# Patient Record
Sex: Male | Born: 1995 | Race: White | Hispanic: No | Marital: Single | Smoking: Never smoker
Health system: Southern US, Community
[De-identification: ages and names within clinical notes are randomized; demographics above are authoritative.]

## PROBLEM LIST (undated history)

## (undated) DIAGNOSIS — N200 Calculus of kidney: Secondary | ICD-10-CM

## (undated) DIAGNOSIS — F909 Attention-deficit hyperactivity disorder, unspecified type: Secondary | ICD-10-CM

## (undated) HISTORY — PX: APPENDECTOMY: SHX54

---
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
## 2013-09-14 ENCOUNTER — Institutional Professional Consult (permissible substitution): Payer: BC Managed Care – PPO | Admitting: Pediatrics

## 2013-10-04 ENCOUNTER — Institutional Professional Consult (permissible substitution): Payer: BC Managed Care – PPO | Admitting: Pediatrics

## 2013-10-04 DIAGNOSIS — F909 Attention-deficit hyperactivity disorder, unspecified type: Secondary | ICD-10-CM

## 2013-10-27 ENCOUNTER — Institutional Professional Consult (permissible substitution): Payer: BC Managed Care – PPO | Admitting: Pediatrics

## 2014-02-28 ENCOUNTER — Institutional Professional Consult (permissible substitution): Payer: BC Managed Care – PPO | Admitting: Pediatrics

## 2015-04-23 ENCOUNTER — Inpatient Hospital Stay (HOSPITAL_COMMUNITY)
Admission: EM | Admit: 2015-04-23 | Discharge: 2015-04-25 | DRG: 494 | Disposition: A | Discharge status: Home / Self Care | Payer: BLUE CROSS/BLUE SHIELD | Attending: Orthopedic Surgery | Admitting: Orthopedic Surgery

## 2015-04-23 ENCOUNTER — Encounter (HOSPITAL_COMMUNITY): Payer: Self-pay | Admitting: Emergency Medicine

## 2015-04-23 ENCOUNTER — Encounter (HOSPITAL_COMMUNITY)
Admission: EM | Disposition: A | Discharge status: Home / Self Care | Payer: Self-pay | Source: Home / Self Care | Attending: Orthopedic Surgery

## 2015-04-23 ENCOUNTER — Emergency Department (HOSPITAL_COMMUNITY): Payer: BLUE CROSS/BLUE SHIELD

## 2015-04-23 ENCOUNTER — Inpatient Hospital Stay (HOSPITAL_COMMUNITY): Payer: BLUE CROSS/BLUE SHIELD

## 2015-04-23 ENCOUNTER — Inpatient Hospital Stay (HOSPITAL_COMMUNITY): Payer: BLUE CROSS/BLUE SHIELD | Admitting: Anesthesiology

## 2015-04-23 DIAGNOSIS — Y92410 Unspecified street and highway as the place of occurrence of the external cause: Secondary | ICD-10-CM

## 2015-04-23 DIAGNOSIS — S82201B Unspecified fracture of shaft of right tibia, initial encounter for open fracture type I or II: Secondary | ICD-10-CM | POA: Diagnosis present

## 2015-04-23 DIAGNOSIS — M79604 Pain in right leg: Secondary | ICD-10-CM | POA: Diagnosis present

## 2015-04-23 DIAGNOSIS — Z8781 Personal history of (healed) traumatic fracture: Secondary | ICD-10-CM

## 2015-04-23 DIAGNOSIS — S82401B Unspecified fracture of shaft of right fibula, initial encounter for open fracture type I or II: Principal | ICD-10-CM | POA: Diagnosis present

## 2015-04-23 DIAGNOSIS — S82409A Unspecified fracture of shaft of unspecified fibula, initial encounter for closed fracture: Secondary | ICD-10-CM

## 2015-04-23 DIAGNOSIS — Z9889 Other specified postprocedural states: Secondary | ICD-10-CM

## 2015-04-23 DIAGNOSIS — S82209A Unspecified fracture of shaft of unspecified tibia, initial encounter for closed fracture: Secondary | ICD-10-CM | POA: Diagnosis present

## 2015-04-23 HISTORY — PX: TIBIA IM NAIL INSERTION: SHX2516

## 2015-04-23 HISTORY — DX: Calculus of kidney: N20.0

## 2015-04-23 HISTORY — DX: Attention-deficit hyperactivity disorder, unspecified type: F90.9

## 2015-04-23 LAB — COMPREHENSIVE METABOLIC PANEL
ALBUMIN: 3.8 g/dL (ref 3.5–5.0)
ALK PHOS: 62 U/L (ref 38–126)
ALT: 30 U/L (ref 17–63)
ANION GAP: 8 (ref 5–15)
AST: 27 U/L (ref 15–41)
BILIRUBIN TOTAL: 0.4 mg/dL (ref 0.3–1.2)
BUN: 12 mg/dL (ref 6–20)
CALCIUM: 8.6 mg/dL — AB (ref 8.9–10.3)
CO2: 25 mmol/L (ref 22–32)
Chloride: 105 mmol/L (ref 101–111)
Creatinine, Ser: 1.12 mg/dL (ref 0.61–1.24)
GFR calc Af Amer: >60 mL/min (ref >60)
GFR calc non Af Amer: >60 mL/min (ref >60)
GLUCOSE: 101 mg/dL — AB (ref 65–99)
Potassium: 3.8 mmol/L (ref 3.5–5.1)
Sodium: 138 mmol/L (ref 135–145)
TOTAL PROTEIN: 6.9 g/dL (ref 6.5–8.1)

## 2015-04-23 LAB — URINALYSIS, ROUTINE W REFLEX MICROSCOPIC
BILIRUBIN URINE: NEGATIVE
GLUCOSE, UA: NEGATIVE mg/dL
Ketones, ur: 15 mg/dL — AB
Nitrite: NEGATIVE
PH: 5 (ref 5.0–8.0)
Protein, ur: 100 mg/dL — AB
Urobilinogen, UA: 0.2 mg/dL (ref 0.0–1.0)

## 2015-04-23 LAB — RAPID URINE DRUG SCREEN, HOSP PERFORMED
AMPHETAMINES: NOT DETECTED
BARBITURATES: NOT DETECTED
Benzodiazepines: NOT DETECTED
Cocaine: NOT DETECTED
Opiates: POSITIVE — AB
TETRAHYDROCANNABINOL: NOT DETECTED

## 2015-04-23 LAB — CBC WITH DIFFERENTIAL/PLATELET
BASOS PCT: 0 %
Basophils Absolute: 0 10*3/uL (ref 0.0–0.1)
Eosinophils Absolute: 0.1 10*3/uL (ref 0.0–0.7)
Eosinophils Relative: 0 %
HEMATOCRIT: 42.2 % (ref 39.0–52.0)
HEMOGLOBIN: 14.3 g/dL (ref 13.0–17.0)
LYMPHS PCT: 8 %
Lymphs Abs: 1.2 10*3/uL (ref 0.7–4.0)
MCH: 28.5 pg (ref 26.0–34.0)
MCHC: 33.9 g/dL (ref 30.0–36.0)
MCV: 84.1 fL (ref 78.0–100.0)
MONOS PCT: 7 %
Monocytes Absolute: 1.2 10*3/uL — ABNORMAL HIGH (ref 0.1–1.0)
NEUTROS ABS: 13.4 10*3/uL — AB (ref 1.7–7.7)
NEUTROS PCT: 85 %
Platelets: 194 10*3/uL (ref 150–400)
RBC: 5.02 MIL/uL (ref 4.22–5.81)
RDW: 13.2 % (ref 11.5–15.5)
WBC: 15.9 10*3/uL — ABNORMAL HIGH (ref 4.0–10.5)

## 2015-04-23 LAB — ETHANOL: Alcohol, Ethyl (B): 55 mg/dL — ABNORMAL HIGH (ref <5)

## 2015-04-23 LAB — SURGICAL PCR SCREEN
MRSA, PCR: NEGATIVE
Staphylococcus aureus: NEGATIVE

## 2015-04-23 LAB — LACTIC ACID, PLASMA: Lactic Acid, Venous: 1.8 mmol/L (ref 0.5–2.0)

## 2015-04-23 LAB — URINE MICROSCOPIC-ADD ON

## 2015-04-23 LAB — LIPASE, BLOOD: Lipase: 27 U/L (ref 22–51)

## 2015-04-23 SURGERY — INSERTION, INTRAMEDULLARY ROD, TIBIA
Anesthesia: General | Laterality: Right

## 2015-04-23 MED ORDER — CEFAZOLIN SODIUM-DEXTROSE 2-3 GM-% IV SOLR
INTRAVENOUS | Status: DC | PRN
  Administered 2015-04-23: 2 g via INTRAVENOUS

## 2015-04-23 MED ORDER — ONDANSETRON HCL 4 MG/2ML IJ SOLN: 4.0000 mg | Freq: Four times a day (QID) | INTRAMUSCULAR | Status: DC | PRN

## 2015-04-23 MED ORDER — BISACODYL 10 MG RE SUPP: 10.0000 mg | Freq: Every day | RECTAL | Status: DC | PRN

## 2015-04-23 MED ORDER — SODIUM CHLORIDE 0.9 % IV BOLUS (SEPSIS)
1000.0000 mL | Freq: Once | INTRAVENOUS | Status: AC
  Administered 2015-04-23: 1000 mL via INTRAVENOUS

## 2015-04-23 MED ORDER — DOCUSATE SODIUM 100 MG PO CAPS: 100.0000 mg | ORAL_CAPSULE | Freq: Two times a day (BID) | ORAL | Status: DC

## 2015-04-23 MED ORDER — ONDANSETRON HCL 4 MG/2ML IJ SOLN
INTRAMUSCULAR | Status: AC
  Filled 2015-04-23: qty 2

## 2015-04-23 MED ORDER — CEFAZOLIN SODIUM 1-5 GM-% IV SOLN
1.0000 g | Freq: Once | INTRAVENOUS | Status: AC
  Administered 2015-04-23: 1 g via INTRAVENOUS
  Filled 2015-04-23: qty 50

## 2015-04-23 MED ORDER — ONDANSETRON HCL 4 MG/2ML IJ SOLN: 4.0000 mg | Freq: Three times a day (TID) | INTRAMUSCULAR | Status: AC | PRN

## 2015-04-23 MED ORDER — MIDAZOLAM HCL 2 MG/2ML IJ SOLN
INTRAMUSCULAR | Status: AC
  Filled 2015-04-23: qty 4

## 2015-04-23 MED ORDER — DOCUSATE SODIUM 100 MG PO CAPS
100.0000 mg | ORAL_CAPSULE | Freq: Two times a day (BID) | ORAL | Status: DC
  Administered 2015-04-23 – 2015-04-25 (×4): 100 mg via ORAL
  Filled 2015-04-23 (×4): qty 1

## 2015-04-23 MED ORDER — SODIUM CHLORIDE 0.9 % IJ SOLN: 3.0000 mL | INTRAMUSCULAR | Status: DC | PRN

## 2015-04-23 MED ORDER — FENTANYL CITRATE (PF) 250 MCG/5ML IJ SOLN
INTRAMUSCULAR | Status: AC
  Filled 2015-04-23: qty 5

## 2015-04-23 MED ORDER — GLYCOPYRROLATE 0.2 MG/ML IJ SOLN
INTRAMUSCULAR | Status: DC | PRN
  Administered 2015-04-23: .7 mg via INTRAVENOUS

## 2015-04-23 MED ORDER — IOHEXOL 300 MG/ML  SOLN
100.0000 mL | Freq: Once | INTRAMUSCULAR | Status: AC | PRN
  Administered 2015-04-23: 100 mL via INTRAVENOUS

## 2015-04-23 MED ORDER — LIDOCAINE HCL (CARDIAC) 20 MG/ML IV SOLN
INTRAVENOUS | Status: AC
  Filled 2015-04-23: qty 10

## 2015-04-23 MED ORDER — METHOCARBAMOL 1000 MG/10ML IJ SOLN
500.0000 mg | Freq: Once | INTRAVENOUS | Status: AC
  Administered 2015-04-23: 500 mg via INTRAVENOUS
  Filled 2015-04-23: qty 5

## 2015-04-23 MED ORDER — ACETAMINOPHEN 650 MG RE SUPP: 650.0000 mg | Freq: Four times a day (QID) | RECTAL | Status: DC | PRN

## 2015-04-23 MED ORDER — SODIUM CHLORIDE 0.9 % IV SOLN: INTRAVENOUS | Status: DC

## 2015-04-23 MED ORDER — FENTANYL CITRATE (PF) 100 MCG/2ML IJ SOLN
25.0000 ug | Freq: Once | INTRAMUSCULAR | Status: AC
  Administered 2015-04-23: 25 ug via INTRAVENOUS
  Filled 2015-04-23: qty 2

## 2015-04-23 MED ORDER — SUCCINYLCHOLINE CHLORIDE 20 MG/ML IJ SOLN
INTRAMUSCULAR | Status: AC
  Filled 2015-04-23: qty 1

## 2015-04-23 MED ORDER — FLEET ENEMA 7-19 GM/118ML RE ENEM: 1.0000 | ENEMA | Freq: Once | RECTAL | Status: DC | PRN

## 2015-04-23 MED ORDER — LACTATED RINGERS IV SOLN
INTRAVENOUS | Status: DC | PRN
  Administered 2015-04-23 (×3): via INTRAVENOUS

## 2015-04-23 MED ORDER — CEFAZOLIN SODIUM 1-5 GM-% IV SOLN
1.0000 g | Freq: Four times a day (QID) | INTRAVENOUS | Status: AC
  Administered 2015-04-23 – 2015-04-24 (×2): 1 g via INTRAVENOUS
  Filled 2015-04-23 (×2): qty 50

## 2015-04-23 MED ORDER — LIDOCAINE HCL (CARDIAC) 20 MG/ML IV SOLN
INTRAVENOUS | Status: AC
  Filled 2015-04-23: qty 5

## 2015-04-23 MED ORDER — ONDANSETRON HCL 4 MG/2ML IJ SOLN
INTRAMUSCULAR | Status: DC | PRN
  Administered 2015-04-23: 4 mg via INTRAVENOUS

## 2015-04-23 MED ORDER — METOCLOPRAMIDE HCL 5 MG/ML IJ SOLN: 5.0000 mg | Freq: Three times a day (TID) | INTRAMUSCULAR | Status: DC | PRN

## 2015-04-23 MED ORDER — NEOSTIGMINE METHYLSULFATE 10 MG/10ML IV SOLN
INTRAVENOUS | Status: DC | PRN
  Administered 2015-04-23: 4 mg via INTRAVENOUS

## 2015-04-23 MED ORDER — OXYCODONE HCL 5 MG PO TABS: 5.0000 mg | ORAL_TABLET | Freq: Once | ORAL | Status: DC | PRN

## 2015-04-23 MED ORDER — HYDROMORPHONE HCL 1 MG/ML IJ SOLN
INTRAMUSCULAR | Status: AC
  Administered 2015-04-23: 2 mg via INTRAVENOUS
  Filled 2015-04-23: qty 1

## 2015-04-23 MED ORDER — HYDROMORPHONE HCL 1 MG/ML IJ SOLN
0.5000 mg | INTRAMUSCULAR | Status: DC | PRN
  Administered 2015-04-24 (×2): 1 mg via INTRAVENOUS
  Filled 2015-04-23 (×2): qty 1

## 2015-04-23 MED ORDER — ACETAMINOPHEN 325 MG PO TABS: 650.0000 mg | ORAL_TABLET | Freq: Four times a day (QID) | ORAL | Status: DC | PRN

## 2015-04-23 MED ORDER — POLYETHYLENE GLYCOL 3350 17 G PO PACK
17.0000 g | PACK | Freq: Every day | ORAL | Status: DC | PRN
  Filled 2015-04-23: qty 1

## 2015-04-23 MED ORDER — ROCURONIUM BROMIDE 100 MG/10ML IV SOLN
INTRAVENOUS | Status: DC | PRN
  Administered 2015-04-23: 50 mg via INTRAVENOUS

## 2015-04-23 MED ORDER — OXYCODONE HCL 5 MG/5ML PO SOLN: 5.0000 mg | Freq: Once | ORAL | Status: DC | PRN

## 2015-04-23 MED ORDER — ROCURONIUM BROMIDE 50 MG/5ML IV SOLN
INTRAVENOUS | Status: AC
  Filled 2015-04-23: qty 1

## 2015-04-23 MED ORDER — TETANUS-DIPHTHERIA TOXOIDS TD 5-2 LFU IM INJ
0.5000 mL | INJECTION | Freq: Once | INTRAMUSCULAR | Status: AC
  Administered 2015-04-23: 0.5 mL via INTRAMUSCULAR
  Filled 2015-04-23: qty 0.5

## 2015-04-23 MED ORDER — HYDROMORPHONE HCL 1 MG/ML IJ SOLN
INTRAMUSCULAR | Status: AC
  Filled 2015-04-23: qty 2

## 2015-04-23 MED ORDER — PHENYLEPHRINE 40 MCG/ML (10ML) SYRINGE FOR IV PUSH (FOR BLOOD PRESSURE SUPPORT)
PREFILLED_SYRINGE | INTRAVENOUS | Status: AC
  Filled 2015-04-23: qty 10

## 2015-04-23 MED ORDER — SODIUM CHLORIDE 0.9 % IV SOLN: 250.0000 mL | INTRAVENOUS | Status: DC | PRN

## 2015-04-23 MED ORDER — PROPOFOL 10 MG/ML IV BOLUS
INTRAVENOUS | Status: DC | PRN
  Administered 2015-04-23: 200 mg via INTRAVENOUS

## 2015-04-23 MED ORDER — LIDOCAINE HCL (CARDIAC) 20 MG/ML IV SOLN
INTRAVENOUS | Status: DC | PRN
  Administered 2015-04-23: 75 mg via INTRAVENOUS

## 2015-04-23 MED ORDER — HYDROMORPHONE HCL 1 MG/ML IJ SOLN
0.5000 mg | INTRAMUSCULAR | Status: DC | PRN
  Administered 2015-04-23: 2 mg via INTRAVENOUS
  Filled 2015-04-23: qty 2

## 2015-04-23 MED ORDER — METOCLOPRAMIDE HCL 5 MG PO TABS: 5.0000 mg | ORAL_TABLET | Freq: Three times a day (TID) | ORAL | Status: DC | PRN

## 2015-04-23 MED ORDER — HYDROCODONE-ACETAMINOPHEN 5-325 MG PO TABS
1.0000 | ORAL_TABLET | ORAL | Status: DC | PRN
  Administered 2015-04-24 – 2015-04-25 (×7): 2 via ORAL
  Filled 2015-04-23 (×7): qty 2

## 2015-04-23 MED ORDER — PROPOFOL 10 MG/ML IV BOLUS
INTRAVENOUS | Status: AC
  Filled 2015-04-23: qty 20

## 2015-04-23 MED ORDER — HYDROMORPHONE HCL 1 MG/ML IJ SOLN
0.2500 mg | INTRAMUSCULAR | Status: DC | PRN
  Administered 2015-04-23 (×2): 0.5 mg via INTRAVENOUS

## 2015-04-23 MED ORDER — SODIUM CHLORIDE 0.9 % IJ SOLN
3.0000 mL | Freq: Two times a day (BID) | INTRAMUSCULAR | Status: DC
  Administered 2015-04-23: 3 mL via INTRAVENOUS

## 2015-04-23 MED ORDER — MIDAZOLAM HCL 5 MG/5ML IJ SOLN
INTRAMUSCULAR | Status: DC | PRN
  Administered 2015-04-23: 2 mg via INTRAVENOUS

## 2015-04-23 MED ORDER — ASPIRIN 325 MG PO TABS
325.0000 mg | ORAL_TABLET | Freq: Two times a day (BID) | ORAL | Status: DC
  Administered 2015-04-24 – 2015-04-25 (×3): 325 mg via ORAL
  Filled 2015-04-23 (×3): qty 1

## 2015-04-23 MED ORDER — ONDANSETRON HCL 4 MG PO TABS: 4.0000 mg | ORAL_TABLET | Freq: Four times a day (QID) | ORAL | Status: DC | PRN

## 2015-04-23 MED ORDER — SODIUM CHLORIDE 0.9 % IR SOLN
Status: DC | PRN
  Administered 2015-04-23: 1000 mL

## 2015-04-23 MED ORDER — HYDROMORPHONE HCL 1 MG/ML IJ SOLN
2.0000 mg | Freq: Once | INTRAMUSCULAR | Status: AC
  Administered 2015-04-23: 2 mg via INTRAVENOUS

## 2015-04-23 MED ORDER — GLYCOPYRROLATE 0.2 MG/ML IJ SOLN
INTRAMUSCULAR | Status: AC
  Filled 2015-04-23: qty 3

## 2015-04-23 MED ORDER — NEOSTIGMINE METHYLSULFATE 10 MG/10ML IV SOLN
INTRAVENOUS | Status: AC
  Filled 2015-04-23: qty 1

## 2015-04-23 MED ORDER — POLYETHYLENE GLYCOL 3350 17 G PO PACK: 17.0000 g | PACK | Freq: Every day | ORAL | Status: DC | PRN

## 2015-04-23 MED ORDER — HYDROCODONE-ACETAMINOPHEN 7.5-325 MG PO TABS: 1.0000 | ORAL_TABLET | Freq: Four times a day (QID) | ORAL | Status: DC | PRN

## 2015-04-23 MED ORDER — FENTANYL CITRATE (PF) 100 MCG/2ML IJ SOLN
INTRAMUSCULAR | Status: DC | PRN
  Administered 2015-04-23: 50 ug via INTRAVENOUS
  Administered 2015-04-23: 150 ug via INTRAVENOUS
  Administered 2015-04-23 (×3): 100 ug via INTRAVENOUS

## 2015-04-23 MED ORDER — HYDROMORPHONE HCL 1 MG/ML IJ SOLN
1.0000 mg | INTRAMUSCULAR | Status: AC | PRN
  Administered 2015-04-23 (×3): 1 mg via INTRAVENOUS
  Filled 2015-04-23 (×3): qty 1

## 2015-04-23 SURGICAL SUPPLY — 65 items
BANDAGE ELASTIC 4 VELCRO ST LF (GAUZE/BANDAGES/DRESSINGS) ×3 IMPLANT
BANDAGE ELASTIC 6 VELCRO ST LF (GAUZE/BANDAGES/DRESSINGS) ×3 IMPLANT
BANDAGE ESMARK 6X9 LF (GAUZE/BANDAGES/DRESSINGS) IMPLANT
BEAD TIP GUIDEWIRE ×3 IMPLANT
BIT DRILL CALIBRATED 4.3X320MM (BIT) ×1 IMPLANT
BIT DRILL CROWE POINT TWST 4.3 (DRILL) ×1 IMPLANT
BNDG COHESIVE 4X5 TAN STRL (GAUZE/BANDAGES/DRESSINGS) IMPLANT
BNDG ESMARK 6X9 LF (GAUZE/BANDAGES/DRESSINGS)
BNDG GAUZE ELAST 4 BULKY (GAUZE/BANDAGES/DRESSINGS) IMPLANT
COVER SURGICAL LIGHT HANDLE (MISCELLANEOUS) ×3 IMPLANT
CUFF TOURNIQUET SINGLE 34IN LL (TOURNIQUET CUFF) ×3 IMPLANT
CUFF TOURNIQUET SINGLE 44IN (TOURNIQUET CUFF) IMPLANT
DRAPE C-ARM 42X72 X-RAY (DRAPES) ×3 IMPLANT
DRAPE C-ARMOR (DRAPES) ×3 IMPLANT
DRAPE IMP U-DRAPE 54X76 (DRAPES) ×3 IMPLANT
DRAPE INCISE IOBAN 66X45 STRL (DRAPES) ×3 IMPLANT
DRAPE ORTHO SPLIT 77X108 STRL (DRAPES) ×4
DRAPE SURG ORHT 6 SPLT 77X108 (DRAPES) ×2 IMPLANT
DRAPE U-SHAPE 47X51 STRL (DRAPES) ×3 IMPLANT
DRILL CALIBRATED 4.3X320MM (BIT) ×3
DRILL CROWE POINT TWIST 4.3 (DRILL) ×3
DRSG ADAPTIC 3X8 NADH LF (GAUZE/BANDAGES/DRESSINGS) IMPLANT
DRSG PAD ABDOMINAL 8X10 ST (GAUZE/BANDAGES/DRESSINGS) ×12 IMPLANT
ELECT REM PT RETURN 9FT ADLT (ELECTROSURGICAL) ×3
ELECTRODE REM PT RTRN 9FT ADLT (ELECTROSURGICAL) ×1 IMPLANT
EVACUATOR 1/8 PVC DRAIN (DRAIN) IMPLANT
GAUZE SPONGE 4X4 12PLY STRL (GAUZE/BANDAGES/DRESSINGS) IMPLANT
GAUZE XEROFORM 5X9 LF (GAUZE/BANDAGES/DRESSINGS) ×3 IMPLANT
GLOVE BIOGEL PI IND STRL 7.5 (GLOVE) ×1 IMPLANT
GLOVE BIOGEL PI INDICATOR 7.5 (GLOVE) ×2
GLOVE SKINSENSE NS SZ8.0 LF (GLOVE) ×4
GLOVE SKINSENSE STRL SZ8.0 LF (GLOVE) ×2 IMPLANT
GLOVE SURG ORTHO 8.0 STRL STRW (GLOVE) ×3 IMPLANT
GOWN STRL REUS W/ TWL LRG LVL3 (GOWN DISPOSABLE) ×3 IMPLANT
GOWN STRL REUS W/TWL LRG LVL3 (GOWN DISPOSABLE) ×6
GUIDEWIRE 2.6X80 BEAD TIP (Wire) ×1 IMPLANT
GUIDWIRE 2.6X80 BEAD TIP (Wire) ×2 IMPLANT
KIT BASIN OR (CUSTOM PROCEDURE TRAY) ×3 IMPLANT
KIT ROOM TURNOVER OR (KITS) ×3 IMPLANT
NAIL TIBIAL PHOENIX 9.0X360 (Nail) ×3 IMPLANT
PACK ORTHO EXTREMITY (CUSTOM PROCEDURE TRAY) ×3 IMPLANT
PACK UNIVERSAL I (CUSTOM PROCEDURE TRAY) ×3 IMPLANT
PAD ARMBOARD 7.5X6 YLW CONV (MISCELLANEOUS) ×6 IMPLANT
PAD CAST 4YDX4 CTTN HI CHSV (CAST SUPPLIES) ×2 IMPLANT
PADDING CAST COTTON 4X4 STRL (CAST SUPPLIES) ×4
SCREW CORT TI DBL LEAD 5X38 (Screw) ×6 IMPLANT
SCREW CORT TI DBL LEAD 5X48 (Screw) ×3 IMPLANT
SCREW CORT TI DBL LEAD 5X50 (Screw) ×3 IMPLANT
SPLINT PLASTER CAST XFAST 5X30 (CAST SUPPLIES) ×1 IMPLANT
SPLINT PLASTER XFAST SET 5X30 (CAST SUPPLIES) ×2
SPONGE GAUZE 4X4 12PLY STER LF (GAUZE/BANDAGES/DRESSINGS) ×3 IMPLANT
STAPLER VISISTAT 35W (STAPLE) ×3 IMPLANT
STOCKINETTE TUBULAR 6 INCH (GAUZE/BANDAGES/DRESSINGS) IMPLANT
SUT PROLENE 3 0 PS 2 (SUTURE) IMPLANT
SUT VIC AB 0 CT1 27 (SUTURE) ×2
SUT VIC AB 0 CT1 27XBRD ANBCTR (SUTURE) ×1 IMPLANT
SUT VIC AB 1 CT1 27 (SUTURE) ×2
SUT VIC AB 1 CT1 27XBRD ANBCTR (SUTURE) ×1 IMPLANT
SUT VIC AB 2-0 CT1 27 (SUTURE) ×2
SUT VIC AB 2-0 CT1 TAPERPNT 27 (SUTURE) ×1 IMPLANT
TOWEL OR 17X24 6PK STRL BLUE (TOWEL DISPOSABLE) ×3 IMPLANT
TOWEL OR 17X26 10 PK STRL BLUE (TOWEL DISPOSABLE) ×3 IMPLANT
TUBE CONNECTING 12'X1/4 (SUCTIONS) ×1
TUBE CONNECTING 12X1/4 (SUCTIONS) ×2 IMPLANT
YANKAUER SUCT BULB TIP NO VENT (SUCTIONS) ×3 IMPLANT
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
## 2015-04-23 NOTE — Brief Op Note (Signed)
04/23/2015  5:11 PM  PATIENT:  Frank Gregory  19 y.o. male  PRE-OPERATIVE DIAGNOSIS:  Grade I open comminuted tibia fibula fracture   POST-OPERATIVE DIAGNOSIS:  Grade I open comminuted tibia fibula fracture  PROCEDURE:  Procedure(s): INTRAMEDULLARY (IM) NAIL TIBIAL (Right) INTRAMEDULLARY (IM) NAIL FIBULA (Right)   Excisional and non-excisional drainage right leg wound  SURGEON:  Surgeon(s) and Role:    * Durene Romans, MD - Primary  PHYSICIAN ASSISTANT: None  ASSISTANTS: Surgical team   ANESTHESIA:   general  EBL:   150cc  BLOOD ADMINISTERED:none  DRAINS: none   LOCAL MEDICATIONS USED:  NONE  SPECIMEN:  No Specimen  DISPOSITION OF SPECIMEN:  N/A  COUNTS:  YES  TOURNIQUET:   27 min at  DICTATION: .Other Dictation: Dictation Number [id number]  PLAN OF CARE: Admit to inpatient   PATIENT DISPOSITION:  PACU - hemodynamically stable.   Delay start of Pharmacological VTE agent (>24hrs) due to surgical blood loss or risk of bleeding: no

## 2015-04-23 NOTE — Interval H&P Note (Signed)
History and Physical Interval Note:  04/23/2015 4:50 PM  [name]  has presented today for surgery, with the diagnosis of right open tibia fibula fracture   The various methods of treatment have been discussed with the patient and family. After consideration of risks, benefits and other options for treatment, the patient has consented to  Procedure(s): INTRAMEDULLARY (IM) NAIL TIBIAL (Right) INTRAMEDULLARY (IM) NAIL FIBULA (Right) as a surgical intervention .  The patient's history has been reviewed, patient examined, no change in status, stable for surgery.  I have reviewed the patient's chart and labs.  Questions were answered to the patient's satisfaction.     Frank Gregory
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
## 2015-04-23 NOTE — ED Notes (Signed)
Patient transported to CT SCAN . 

## 2015-04-23 NOTE — ED Notes (Signed)
Pt in radiology 

## 2015-04-23 NOTE — Progress Notes (Signed)
Orthopedic Tech Progress Note Patient Details:  Frank Gregory April 18, 1996 [id number]  Ortho Devices Type of Ortho Device: Ace wrap, Post (short leg) splint Ortho Device/Splint Interventions: Application   Saul Fordyce 04/23/2015, 10:41 AM
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
## 2015-04-23 NOTE — Anesthesia Preprocedure Evaluation (Signed)
Anesthesia Evaluation  Patient identified by MRN, date of birth, ID band Patient awake    Reviewed: Allergy & Precautions, NPO status , Patient's Chart, lab work & pertinent test results  Airway Mallampati: I   Neck ROM: full    Dental   Pulmonary neg pulmonary ROS,    breath sounds clear to auscultation       Cardiovascular negative cardio ROS   Rhythm:regular Rate:Normal     Neuro/Psych PSYCHIATRIC DISORDERS ADHD   GI/Hepatic   Endo/Other    Renal/GU      Musculoskeletal   Abdominal   Peds  Hematology   Anesthesia Other Findings   Reproductive/Obstetrics                             Anesthesia Physical Anesthesia Plan  ASA: I  Anesthesia Plan: General   Post-op Pain Management:    Induction: Intravenous  Airway Management Planned: Oral ETT  Additional Equipment:   Intra-op Plan:   Post-operative Plan: Extubation in OR  Informed Consent: I have reviewed the patients History and Physical, chart, labs and discussed the procedure including the risks, benefits and alternatives for the proposed anesthesia with the patient or authorized representative who has indicated his/her understanding and acceptance.     Plan Discussed with: CRNA, Anesthesiologist and Surgeon  Anesthesia Plan Comments:         Anesthesia Quick Evaluation
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
## 2015-04-23 NOTE — Anesthesia Procedure Notes (Signed)
Procedure Name: Intubation Date/Time: 04/23/2015 5:31 PM Performed by: Eligha Bridegroom Pre-anesthesia Checklist: Patient identified, Emergency Drugs available, Suction available, Patient being monitored and Timeout performed Patient Re-evaluated:Patient Re-evaluated prior to inductionOxygen Delivery Method: Circle system utilized Preoxygenation: Pre-oxygenation with 100% oxygen Intubation Type: IV induction Ventilation: Mask ventilation without difficulty and Oral airway inserted - appropriate to patient size Laryngoscope Size: Mac and 4 Grade View: Grade I Tube size: 7.5 mm Number of attempts: 1 Airway Equipment and Method: Stylet and LTA kit utilized Secured at: 22 cm Tube secured with: Tape Dental Injury: Teeth and Oropharynx as per pre-operative assessment

## 2015-04-23 NOTE — Transfer of Care (Signed)
Immediate Anesthesia Transfer of Care Note  Patient: Frank Gregory  Procedure(s) Performed: Procedure(s): INTRAMEDULLARY (IM) NAIL TIBIAL (Right)  Patient Location: PACU  Anesthesia Type:General  Level of Consciousness: awake, alert  and oriented  Airway & Oxygen Therapy: Patient Spontanous Breathing and Patient connected to nasal cannula oxygen  Post-op Assessment: Report given to RN and Post -op Vital signs reviewed and stable  Post vital signs: Reviewed and stable  Last Vitals:  Filed Vitals:   04/23/15 2015  BP: 140/93  Pulse: 106  Temp:   Resp: 24    Complications: No apparent anesthesia complications

## 2015-04-23 NOTE — ED Notes (Addendum)
Ortho tech at bedside placing splint. Betadine gauze placed over wound. PMS intact. Pt made aware to call with any worsening pain or numbness.

## 2015-04-23 NOTE — Progress Notes (Signed)
Utilization Review Completed. Genese M Crutchfield, RNCM [phone] 

## 2015-04-23 NOTE — ED Notes (Signed)
Ortho PA at bedside.  
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
## 2015-04-24 ENCOUNTER — Encounter (HOSPITAL_COMMUNITY): Payer: Self-pay | Admitting: Orthopedic Surgery

## 2015-04-24 LAB — COMPREHENSIVE METABOLIC PANEL
ALBUMIN: 3.1 g/dL — AB (ref 3.5–5.0)
ALK PHOS: 55 U/L (ref 38–126)
ALT: 21 U/L (ref 17–63)
ANION GAP: 7 (ref 5–15)
AST: 36 U/L (ref 15–41)
BUN: 10 mg/dL (ref 6–20)
CALCIUM: 8.5 mg/dL — AB (ref 8.9–10.3)
CHLORIDE: 103 mmol/L (ref 101–111)
CO2: 26 mmol/L (ref 22–32)
Creatinine, Ser: 1.04 mg/dL (ref 0.61–1.24)
GFR calc non Af Amer: >60 mL/min (ref >60)
GLUCOSE: 104 mg/dL — AB (ref 65–99)
POTASSIUM: 4 mmol/L (ref 3.5–5.1)
SODIUM: 136 mmol/L (ref 135–145)
Total Bilirubin: 1.1 mg/dL (ref 0.3–1.2)
Total Protein: 5.9 g/dL — ABNORMAL LOW (ref 6.5–8.1)

## 2015-04-24 LAB — CBC
HEMATOCRIT: 37 % — AB (ref 39.0–52.0)
HEMOGLOBIN: 12.2 g/dL — AB (ref 13.0–17.0)
MCH: 28.2 pg (ref 26.0–34.0)
MCHC: 33 g/dL (ref 30.0–36.0)
MCV: 85.6 fL (ref 78.0–100.0)
Platelets: 157 10*3/uL (ref 150–400)
RBC: 4.32 MIL/uL (ref 4.22–5.81)
RDW: 13.2 % (ref 11.5–15.5)
WBC: 8.9 10*3/uL (ref 4.0–10.5)

## 2015-04-24 NOTE — Care Management Note (Signed)
Case Management Note  Patient Details  Name: Frank Gregory MRN: [id number] Date of Birth: 1996-05-22  Subjective/Objective:         Admitted with right tibia/fibula fracture , s/p ORIF          Action/Plan: PT not recommending HHPT or any equipment. Spoke with patient, he states that he has a rolling walker and cane at home.Patient states that he will have family available to assist after discharge. CM will continue to follow for any discharge needs.   Expected Discharge Date:                  Expected Discharge Plan:  Home/Self Care  In-House Referral:  NA  Discharge planning Services  CM Consult  Post Acute Care Choice:  NA Choice offered to:  NA  DME Arranged:    DME Agency:     HH Arranged:    HH Agency:     Status of Service:  In process, will continue to follow  Medicare Important Message Given:    Date Medicare IM Given:    Medicare IM give by:    Date Additional Medicare IM Given:    Additional Medicare Important Message give by:     If discussed at Long Length of Stay Meetings, dates discussed:    Additional Comments:  Monica Becton, RN 04/24/2015, 2:42 PM
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
## 2015-04-24 NOTE — Progress Notes (Signed)
Patient ID: Frank Gregory, male   DOB: 07-Oct-1995, 19 y.o.   MRN: [id number] Subjective: 1 Day Post-Op Procedure(s) (LRB): INTRAMEDULLARY (IM) NAIL TIBIAL (Right)    Patient reports pain as moderate but a lot better than yesterday he feels.  No events over night other wise  Objective:   VITALS:   Filed Vitals:   04/24/15 0533  BP: 136/83  Pulse: 99  Temp: 98.5 F (36.9 C)  Resp: 17    Neurologically intact Incision: dressing C/D/I, splint dry Movers toes, intact sensibility  LABS  Recent Labs  04/23/15 0722 04/24/15 0445  HGB 14.3 12.2*  HCT 42.2 37.0*  WBC 15.9* 8.9  PLT 194 157     Recent Labs  04/23/15 0722 04/24/15 0445  NA 138 136  K 3.8 4.0  BUN 12 10  CREATININE 1.12 1.04  GLUCOSE 101* 104*    No results for input(s): LABPT, INR in the last 72 hours.   Assessment/Plan: 1 Day Post-Op Procedure(s) (LRB): INTRAMEDULLARY (IM) NAIL TIBIAL (Right)   Advance diet Up with therapy Plan for discharge tomorrow   Will have him work with therapy with NWB restrictions  Make sure oral pain meds adequate to maintain control of post op pain

## 2015-04-25 MED ORDER — POLYETHYLENE GLYCOL 3350 17 G PO PACK: 17.0000 g | PACK | Freq: Every day | ORAL | Status: AC | PRN

## 2015-04-25 MED ORDER — DOCUSATE SODIUM 100 MG PO CAPS: 100.0000 mg | ORAL_CAPSULE | Freq: Two times a day (BID) | ORAL | Status: AC

## 2015-04-25 MED ORDER — ASPIRIN 325 MG PO TABS: 325.0000 mg | ORAL_TABLET | Freq: Two times a day (BID) | ORAL | Status: AC

## 2015-04-25 MED ORDER — HYDROCODONE-ACETAMINOPHEN 5-325 MG PO TABS: 1.0000 | ORAL_TABLET | Freq: Four times a day (QID) | ORAL | Status: AC | PRN

## 2015-04-25 NOTE — Discharge Instructions (Signed)
NWB Right lower extremity Keep splint dry  May remove upper portion of ACE wrap to change dressing over knee to Band-Aid if like Call office with any questions

## 2015-04-25 NOTE — Progress Notes (Signed)
Patient ID: Frank Gregory, male   DOB: Nov 06, 1995, 19 y.o.   MRN: [id number] Subjective: 2 Days Post-Op Procedure(s) (LRB): INTRAMEDULLARY (IM) NAIL TIBIAL (Right)    Patient reports pain as mild.  Relatively comfortable, pain well controlled.  Ready to head home  Objective:   VITALS:   Filed Vitals:   04/25/15 0555  BP: 125/75  Pulse: 89  Temp: 99.1 F (37.3 C)  Resp: 17    Neurovascular intact Incision: dressing C/D/I  LABS  Recent Labs  04/23/15 0722 04/24/15 0445  HGB 14.3 12.2*  HCT 42.2 37.0*  WBC 15.9* 8.9  PLT 194 157     Recent Labs  04/23/15 0722 04/24/15 0445  NA 138 136  K 3.8 4.0  BUN 12 10  CREATININE 1.12 1.04  GLUCOSE 101* 104*    No results for input(s): LABPT, INR in the last 72 hours.   Assessment/Plan: 2 Days Post-Op Procedure(s) (LRB): INTRAMEDULLARY (IM) NAIL TIBIAL (Right)   PLAN: D/C to home to today NWB RTC in 2 weeks Instructions and Rx provided

## 2015-04-25 NOTE — Anesthesia Postprocedure Evaluation (Signed)
Anesthesia Post Note  Patient: Frank Gregory  Procedure(s) Performed: Procedure(s) (LRB): INTRAMEDULLARY (IM) NAIL TIBIAL (Right)  Anesthesia type: General  Patient location: PACU  Post pain: Pain level controlled and Adequate analgesia  Post assessment: Post-op Vital signs reviewed, Patient's Cardiovascular Status Stable, Respiratory Function Stable, Patent Airway and Pain level controlled  Last Vitals:  Filed Vitals:   04/25/15 0555  BP: 125/75  Pulse: 89  Temp: 37.3 C  Resp: 17    Post vital signs: Reviewed and stable  Level of consciousness: awake, alert  and oriented  Complications: No apparent anesthesia complications
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
## 2016-12-05 ENCOUNTER — Other Ambulatory Visit: Payer: Self-pay | Admitting: Orthopedic Surgery

## 2016-12-05 DIAGNOSIS — Z4789 Encounter for other orthopedic aftercare: Secondary | ICD-10-CM

## 2016-12-17 ENCOUNTER — Other Ambulatory Visit: Payer: Self-pay | Admitting: Orthopedic Surgery

## 2016-12-17 ENCOUNTER — Ambulatory Visit
Admission: RE | Admit: 2016-12-17 | Discharge: 2016-12-17 | Disposition: A | Discharge status: Home / Self Care | Payer: BLUE CROSS/BLUE SHIELD | Source: Ambulatory Visit | Attending: Orthopedic Surgery | Admitting: Orthopedic Surgery

## 2016-12-17 DIAGNOSIS — Z4789 Encounter for other orthopedic aftercare: Secondary | ICD-10-CM
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
## 2021-09-19 DIAGNOSIS — B078 Other viral warts: Secondary | ICD-10-CM | POA: Diagnosis not present

## 2022-05-11 NOTE — Progress Notes (Signed)
  Subjective:  Patient ID: Frank Gregory is a 26 y.o. male here for a Flu Vaccine visit.                          Lifestyle: Frank Gregory reports that he has never smoked. He has never used smokeless tobacco.   Objective:   Assessment/Plan:  Vaccine administered in accordance with [facility] guidelines.   Patient advised to contact [facility] if adverse event occurs.

## 2022-05-11 NOTE — Progress Notes (Signed)
 Subjective:  Patient ID: Frank Gregory is a 26 y.o. male.  Chief Complaint  Patient presents with  . Adult Well Visit Male (18-39 years)     HPI  Presents for annual wellness exam as per required for insurance.  No PCP.     Adult Well Visit Male (18-39 years) Have you had a general wellness exam in the last 12 months?: No   Date of last physical: several years ago  Sexually active:  Is not sexually active Colon cancer risk: none   Diabetes risk: none   Skin risk: none   Do you drink alcohol: Yes (3-4 beers on weekends )   Do you use tobacco: No   Regular exercise: Yes   Are you following a special diet?: No   Do you feel safe at home?: Yes          Review of Systems  Social History   Tobacco Use  Smoking Status Never  Smokeless Tobacco Never   Past Medical History:  Diagnosis Date  . Allergic disorder    History reviewed. No pertinent surgical history. History reviewed. No pertinent family history. Objective:      Physical Exam Vitals reviewed.  Constitutional:      General: He is not in acute distress.    Appearance: Normal appearance. He is not ill-appearing.  HENT:     Head: Normocephalic and atraumatic.     Right Ear: Hearing, tympanic membrane, ear canal and external ear normal.     Left Ear: Hearing, tympanic membrane, ear canal and external ear normal.     Nose: Nose normal. No mucosal edema, congestion or rhinorrhea.     Right Sinus: No maxillary sinus tenderness or frontal sinus tenderness.     Left Sinus: No maxillary sinus tenderness or frontal sinus tenderness.     Mouth/Throat:     Mouth: Mucous membranes are moist.     Pharynx: Oropharynx is clear. No pharyngeal swelling, oropharyngeal exudate or posterior oropharyngeal erythema.     Tonsils: 1+ on the right. 1+ on the left.  Eyes:     Extraocular Movements: Extraocular movements intact.     Conjunctiva/sclera: Conjunctivae normal.     Pupils: Pupils are equal, round, and reactive to  light.  Cardiovascular:     Rate and Rhythm: Normal rate.     Pulses: Normal pulses.     Heart sounds: Normal heart sounds. No murmur heard.   No friction rub. No gallop.  Pulmonary:     Effort: Pulmonary effort is normal. No respiratory distress.     Breath sounds: Normal breath sounds. No stridor. No wheezing, rhonchi or rales.  Chest:     Chest wall: No tenderness.  Abdominal:     Palpations: Abdomen is soft. Abdomen is not rigid.     Tenderness: There is no abdominal tenderness.  Musculoskeletal:        General: Normal range of motion.  Lymphadenopathy:     Cervical: No cervical adenopathy.  Skin:    General: Skin is warm and dry.  Neurological:     General: No focal deficit present.     Mental Status: He is alert and oriented to person, place, and time.  Psychiatric:        Mood and Affect: Mood normal.        Behavior: Behavior normal.       Assessment/Plan:  HPI provided by Self  Based on today's visit:history and physical exam only, as no relevant testing deemed necessary  patient's visit diagnosis is/includes  1. Encounter for general adult medical examination without abnormal findings    Patient does not have a history of chronic conditions.  Treatment plan includes:  Orders Placed: Orders Placed This Encounter  Procedures  . Basic metabolic panel  . Lipid panel  . CBC w/plt   Medications ordered this visit    No prescriptions requested or ordered in this encounter    Current medication list and any new medications prescribed or recommended today were reviewed with the patient and specific instructions were provided Yes  Provider Recommendations   Lab req given to patient to go to [facility] for blood collection.    Provider will call patient with lab results   Follow up care instructions were provided and reviewed?with the  Patient. All questions were answered. Patient verbalized understanding of plan of care today.

## 2022-05-15 DIAGNOSIS — Z0000 Encounter for general adult medical examination without abnormal findings: Secondary | ICD-10-CM | POA: Diagnosis not present

## 2022-05-17 NOTE — Telephone Encounter (Signed)
   Call [employer] Call being placed to:: Patient     Call Reason (PT) What is the reason for the call?: Other Other Call Reason:: Patient notified of lab results. CBC and BMP normal. Lipid panel abnormal. Discussed lifestyle changes and establishing new PCP care as soon as possible for further chronic care disease management. Patient voiced understanding.

## 2022-05-22 DIAGNOSIS — R635 Abnormal weight gain: Secondary | ICD-10-CM | POA: Diagnosis not present

## 2022-05-22 DIAGNOSIS — Z6835 Body mass index (BMI) 35.0-35.9, adult: Secondary | ICD-10-CM | POA: Diagnosis not present

## 2022-06-05 DIAGNOSIS — R635 Abnormal weight gain: Secondary | ICD-10-CM | POA: Diagnosis not present

## 2022-06-15 DIAGNOSIS — E782 Mixed hyperlipidemia: Secondary | ICD-10-CM | POA: Diagnosis not present

## 2022-06-15 DIAGNOSIS — R635 Abnormal weight gain: Secondary | ICD-10-CM | POA: Diagnosis not present

## 2022-06-15 DIAGNOSIS — Z6839 Body mass index (BMI) 39.0-39.9, adult: Secondary | ICD-10-CM | POA: Diagnosis not present

## 2022-07-10 DIAGNOSIS — E782 Mixed hyperlipidemia: Secondary | ICD-10-CM | POA: Diagnosis not present

## 2022-07-10 DIAGNOSIS — R635 Abnormal weight gain: Secondary | ICD-10-CM | POA: Diagnosis not present

## 2022-07-10 DIAGNOSIS — Z6839 Body mass index (BMI) 39.0-39.9, adult: Secondary | ICD-10-CM | POA: Diagnosis not present

## 2022-08-07 DIAGNOSIS — Z1339 Encounter for screening examination for other mental health and behavioral disorders: Secondary | ICD-10-CM | POA: Diagnosis not present

## 2022-08-07 DIAGNOSIS — Z1331 Encounter for screening for depression: Secondary | ICD-10-CM | POA: Diagnosis not present

## 2022-08-07 DIAGNOSIS — E782 Mixed hyperlipidemia: Secondary | ICD-10-CM | POA: Diagnosis not present

## 2022-08-07 DIAGNOSIS — Z6839 Body mass index (BMI) 39.0-39.9, adult: Secondary | ICD-10-CM | POA: Diagnosis not present

## 2022-11-07 ENCOUNTER — Other Ambulatory Visit (HOSPITAL_BASED_OUTPATIENT_CLINIC_OR_DEPARTMENT_OTHER): Payer: Self-pay

## 2022-11-08 ENCOUNTER — Other Ambulatory Visit (HOSPITAL_BASED_OUTPATIENT_CLINIC_OR_DEPARTMENT_OTHER): Payer: Self-pay

## 2022-11-08 MED ORDER — TIRZEPATIDE-WEIGHT MANAGEMENT 12.5 MG/0.5ML ~~LOC~~ SOAJ
12.5000 mg | SUBCUTANEOUS | 0 refills | Status: AC
  Filled 2022-11-08: qty 2, 28d supply, fill #0

## 2024-04-13 ENCOUNTER — Ambulatory Visit (HOSPITAL_COMMUNITY): Admission: EM | Admit: 2024-04-13 | Discharge: 2024-04-13 | Disposition: A | Discharge status: Home / Self Care

## 2024-04-13 DIAGNOSIS — Z760 Encounter for issue of repeat prescription: Secondary | ICD-10-CM

## 2024-04-13 NOTE — Discharge Instructions (Signed)
 Follow-up with outpatient provider for medication refill of Concerta
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
## 2024-04-13 NOTE — ED Provider Notes (Signed)
 [facility] Medical Screening Exam  Patient Name: Frank Gregory MRN: [id number] Date of Evaluation: 04/14/24 Chief Complaint:  medication refil Diagnosis:  Final diagnoses:  Encounter for medication refill    History of Present illness: Frank Gregory is a 28 y.o. male. According to the patient he was diagnose with ADHD and has not taken any medication in 5 yrs.  Per the patient he is looking to get back on Concerta.  Patient currently does not see a psych provider or therapist.  Patient currently lives with his mother.  According to him he is employed.  Writer discussed with patient that at this time he would need to be seen by a new provider who is going to see on a regular basis and have an evaluation for ADHD.  Discussed with patient that at this time we will not be able to write him a stimulant however he could try the walk-in psychiatry clinic tomorrow.  Face-to-face observation of patient, patient is alert and oriented x 4, speech is clear, maintain eye contact.  Patient does not appear to be in any distress does not seem to be a risk to himself or others.  Patient denies SI, HI, AVH or paranoia.  Denies alcohol use.  Denies illicit drug use.  Denies smoking.  At this present moment patient does not seem to be influenced by internal stimuli.  Writer discussed with patient that he would need to reach out to a med provider to have Concerta started.  According to him he has not been on any medicine and 5 years.  And the only diagnosis he has is ADHD.  Patient is encouraged to call 911 or return to the nearest ED should he experience any suicidal thoughts homicidal ideation or hallucination.  Patient verbalized understanding. Writer did try to give patient resources for outside provider patient stated he did not really need any resources this time.  Patient stated that he thought he just could come here and we would start the Concerta for him.  Flowsheet Row ED from  04/13/2024 in [facility]  C-SSRS RISK CATEGORY No Risk    Psychiatric Specialty Exam  Presentation  General Appearance:Casual  Eye Contact:Good  Speech:Clear and Coherent  Speech Volume:Normal  Handedness:Right   Mood and Affect  Mood: Anxious  Affect: Appropriate   Thought Process  Thought Processes: Coherent  Descriptions of Associations:Intact  Orientation:Full (Time, Place and Person)  Thought Content:Logical    Hallucinations:None  Ideas of Reference:None  Suicidal Thoughts:No  Homicidal Thoughts:No   Sensorium  Memory: Immediate Good  Judgment: Fair  Insight: Fair   Art therapist  Concentration: Fair  Attention Span: Fair  Recall: [employer] of Knowledge: Fair  Language: Fair   Psychomotor Activity  Psychomotor Activity: Normal   Assets  Assets: Communication Skills   Sleep  Sleep: Fair  Number of hours:  8   Physical Exam: Physical Exam HENT:     Head: Normocephalic.     Nose: Nose normal.  Eyes:     Pupils: Pupils are equal, round, and reactive to light.  Cardiovascular:     Rate and Rhythm: Normal rate.  Pulmonary:     Effort: Pulmonary effort is normal.  Musculoskeletal:        General: Normal range of motion.     Cervical back: Normal range of motion.  Neurological:     General: No focal deficit present.     Mental Status: He is alert.  Psychiatric:        Thought Content: Thought content normal.    Review of Systems  Constitutional: Negative.   HENT: Negative.    Eyes: Negative.   Respiratory: Negative.    Cardiovascular: Negative.   Gastrointestinal: Negative.   Genitourinary: Negative.   Musculoskeletal: Negative.   Skin: Negative.   Neurological: Negative.   Psychiatric/Behavioral:  The patient is nervous/anxious.    Blood pressure 109/80, pulse 89, temperature 98.3 F (36.8 C), temperature source Oral, resp. rate 18, SpO2 100%. There is no  height or weight on file to calculate BMI.  Musculoskeletal: Strength & Muscle Tone: within normal limits Gait & Station: normal Patient leans: N/A   [facility] MSE Discharge Disposition for Follow up and Recommendations: Based on my evaluation the patient does not appear to have an emergency medical condition and can be discharged with resources and follow up care in outpatient services for Medication Management   Gaither Pouch, NP 04/14/2024, 4:04 AM
